# Patient Record
Sex: Female | Born: 1975 | Race: Black or African American | Hispanic: No
Health system: Southern US, Community
[De-identification: ages and names within clinical notes are randomized; demographics above are authoritative.]

---

## 1999-04-18 ENCOUNTER — Ambulatory Visit (HOSPITAL_COMMUNITY): Admission: RE | Admit: 1999-04-18 | Discharge: 1999-04-18 | Payer: Self-pay | Admitting: *Deleted

## 1999-05-01 ENCOUNTER — Encounter: Admission: RE | Admit: 1999-05-01 | Discharge: 1999-05-01 | Payer: Self-pay | Admitting: Obstetrics & Gynecology

## 1999-05-15 ENCOUNTER — Encounter: Admission: RE | Admit: 1999-05-15 | Discharge: 1999-05-15 | Payer: Self-pay | Admitting: Obstetrics & Gynecology

## 1999-05-15 ENCOUNTER — Other Ambulatory Visit: Admission: RE | Admit: 1999-05-15 | Discharge: 1999-05-15 | Payer: Self-pay | Admitting: Obstetrics & Gynecology

## 1999-05-22 ENCOUNTER — Encounter: Admission: RE | Admit: 1999-05-22 | Discharge: 1999-05-22 | Payer: Self-pay | Admitting: Obstetrics & Gynecology

## 1999-05-29 ENCOUNTER — Encounter: Admission: RE | Admit: 1999-05-29 | Discharge: 1999-05-29 | Payer: Self-pay | Admitting: Obstetrics & Gynecology

## 1999-06-12 ENCOUNTER — Inpatient Hospital Stay (HOSPITAL_COMMUNITY): Admission: AD | Admit: 1999-06-12 | Discharge: 1999-06-12 | Payer: Self-pay | Admitting: *Deleted

## 1999-06-12 ENCOUNTER — Encounter: Admission: RE | Admit: 1999-06-12 | Discharge: 1999-06-12 | Payer: Self-pay | Admitting: Obstetrics & Gynecology

## 1999-06-19 ENCOUNTER — Inpatient Hospital Stay (HOSPITAL_COMMUNITY): Admission: AD | Admit: 1999-06-19 | Discharge: 1999-06-19 | Payer: Self-pay | Admitting: Obstetrics

## 1999-06-19 ENCOUNTER — Ambulatory Visit (HOSPITAL_COMMUNITY): Admission: RE | Admit: 1999-06-19 | Discharge: 1999-06-19 | Payer: Self-pay | Admitting: Obstetrics & Gynecology

## 1999-06-21 ENCOUNTER — Inpatient Hospital Stay (HOSPITAL_COMMUNITY): Admission: AD | Admit: 1999-06-21 | Discharge: 1999-06-21 | Payer: Self-pay | Admitting: *Deleted

## 1999-06-26 ENCOUNTER — Encounter: Admission: RE | Admit: 1999-06-26 | Discharge: 1999-06-26 | Payer: Self-pay | Admitting: Obstetrics & Gynecology

## 1999-07-10 ENCOUNTER — Inpatient Hospital Stay (HOSPITAL_COMMUNITY): Admission: AD | Admit: 1999-07-10 | Discharge: 1999-07-10 | Payer: Self-pay | Admitting: Obstetrics

## 1999-07-10 ENCOUNTER — Encounter: Admission: RE | Admit: 1999-07-10 | Discharge: 1999-07-10 | Payer: Self-pay | Admitting: Obstetrics & Gynecology

## 1999-07-10 ENCOUNTER — Encounter (HOSPITAL_COMMUNITY): Admission: RE | Admit: 1999-07-10 | Discharge: 1999-07-29 | Payer: Self-pay | Admitting: Obstetrics & Gynecology

## 1999-07-17 ENCOUNTER — Inpatient Hospital Stay (HOSPITAL_COMMUNITY): Admission: AD | Admit: 1999-07-17 | Discharge: 1999-07-17 | Payer: Self-pay | Admitting: Obstetrics

## 1999-07-17 ENCOUNTER — Encounter: Admission: RE | Admit: 1999-07-17 | Discharge: 1999-07-17 | Payer: Self-pay | Admitting: Obstetrics & Gynecology

## 1999-07-24 ENCOUNTER — Encounter: Admission: RE | Admit: 1999-07-24 | Discharge: 1999-07-24 | Payer: Self-pay | Admitting: Obstetrics & Gynecology

## 1999-07-29 ENCOUNTER — Encounter (INDEPENDENT_AMBULATORY_CARE_PROVIDER_SITE_OTHER): Payer: Self-pay | Admitting: Specialist

## 1999-07-29 ENCOUNTER — Inpatient Hospital Stay (HOSPITAL_COMMUNITY): Admission: AD | Admit: 1999-07-29 | Discharge: 1999-07-31 | Payer: Self-pay | Admitting: Obstetrics

## 1999-08-01 ENCOUNTER — Encounter (HOSPITAL_COMMUNITY): Admission: RE | Admit: 1999-08-01 | Discharge: 1999-10-30 | Payer: Self-pay | Admitting: *Deleted

## 2001-01-14 ENCOUNTER — Encounter: Payer: Self-pay | Admitting: Emergency Medicine

## 2001-01-14 ENCOUNTER — Emergency Department (HOSPITAL_COMMUNITY): Admission: EM | Admit: 2001-01-14 | Discharge: 2001-01-14 | Payer: Self-pay | Admitting: Emergency Medicine

## 2001-06-24 ENCOUNTER — Encounter: Payer: Self-pay | Admitting: Emergency Medicine

## 2001-06-24 ENCOUNTER — Emergency Department (HOSPITAL_COMMUNITY): Admission: EM | Admit: 2001-06-24 | Discharge: 2001-06-24 | Payer: Self-pay | Admitting: Nurse Practitioner

## 2001-06-25 ENCOUNTER — Emergency Department (HOSPITAL_COMMUNITY): Admission: EM | Admit: 2001-06-25 | Discharge: 2001-06-25 | Payer: Self-pay | Admitting: Emergency Medicine

## 2001-07-13 ENCOUNTER — Encounter: Payer: Self-pay | Admitting: Specialist

## 2001-07-13 ENCOUNTER — Encounter: Admission: RE | Admit: 2001-07-13 | Discharge: 2001-07-13 | Payer: Self-pay | Admitting: Specialist

## 2002-01-30 ENCOUNTER — Emergency Department (HOSPITAL_COMMUNITY): Admission: EM | Admit: 2002-01-30 | Discharge: 2002-01-30 | Payer: Self-pay | Admitting: Emergency Medicine

## 2002-01-30 ENCOUNTER — Encounter: Payer: Self-pay | Admitting: Emergency Medicine

## 2005-06-12 ENCOUNTER — Ambulatory Visit: Payer: Self-pay | Admitting: Psychiatry

## 2005-06-12 ENCOUNTER — Inpatient Hospital Stay (HOSPITAL_COMMUNITY): Admission: EM | Admit: 2005-06-12 | Discharge: 2005-06-13 | Payer: Self-pay | Admitting: Psychiatry

## 2005-08-30 ENCOUNTER — Emergency Department (HOSPITAL_COMMUNITY): Admission: EM | Admit: 2005-08-30 | Discharge: 2005-08-30 | Payer: Self-pay | Admitting: Emergency Medicine

## 2006-01-19 ENCOUNTER — Emergency Department (HOSPITAL_COMMUNITY): Admission: EM | Admit: 2006-01-19 | Discharge: 2006-01-19 | Payer: Self-pay | Admitting: Emergency Medicine

## 2006-01-22 ENCOUNTER — Emergency Department (HOSPITAL_COMMUNITY): Admission: EM | Admit: 2006-01-22 | Discharge: 2006-01-22 | Payer: Self-pay | Admitting: Family Medicine

## 2007-01-28 IMAGING — CR DG FOOT COMPLETE 3+V*L*
3 series · 3 of 3 positions shown · non-contrast
Comparison: None.

CLINICAL DATA: Pain at ball of foot for two days.  No known injury.
 LEFT FOOT - 3 VIEWS:

[view not recorded (1 of 3)]
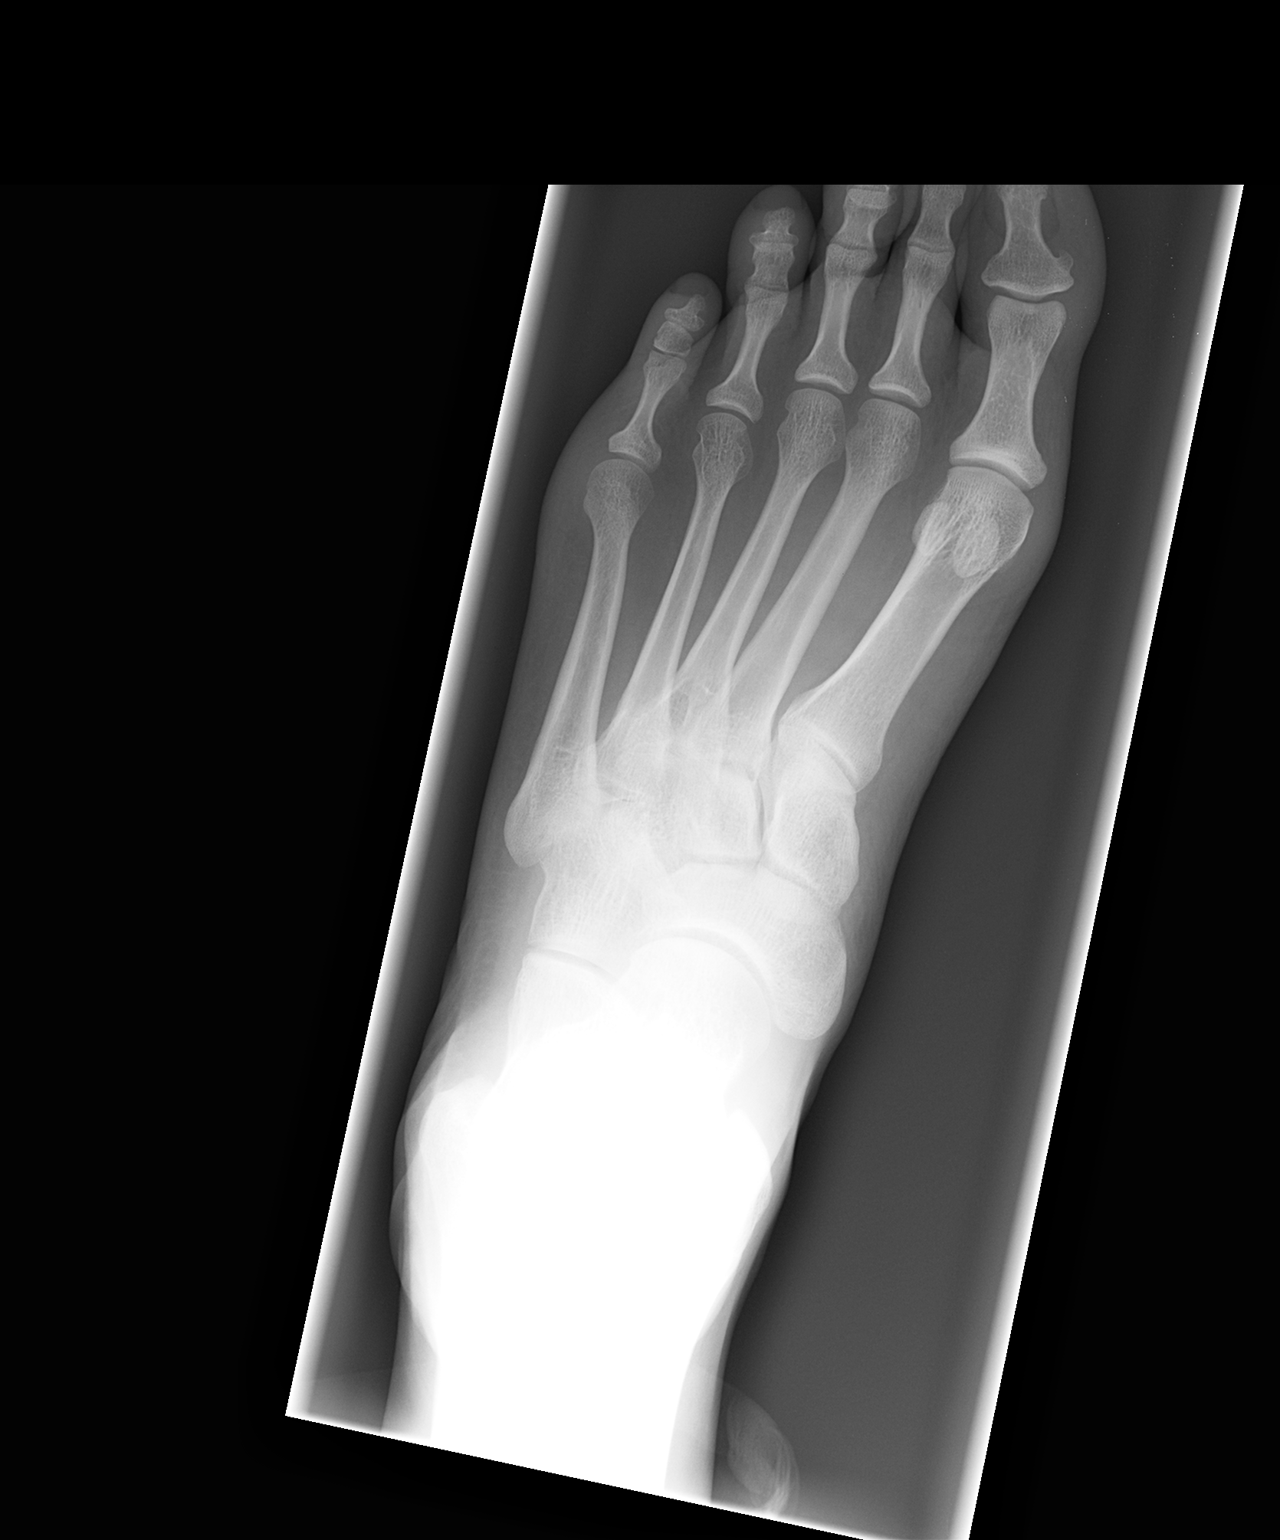

[view not recorded (2 of 3)]
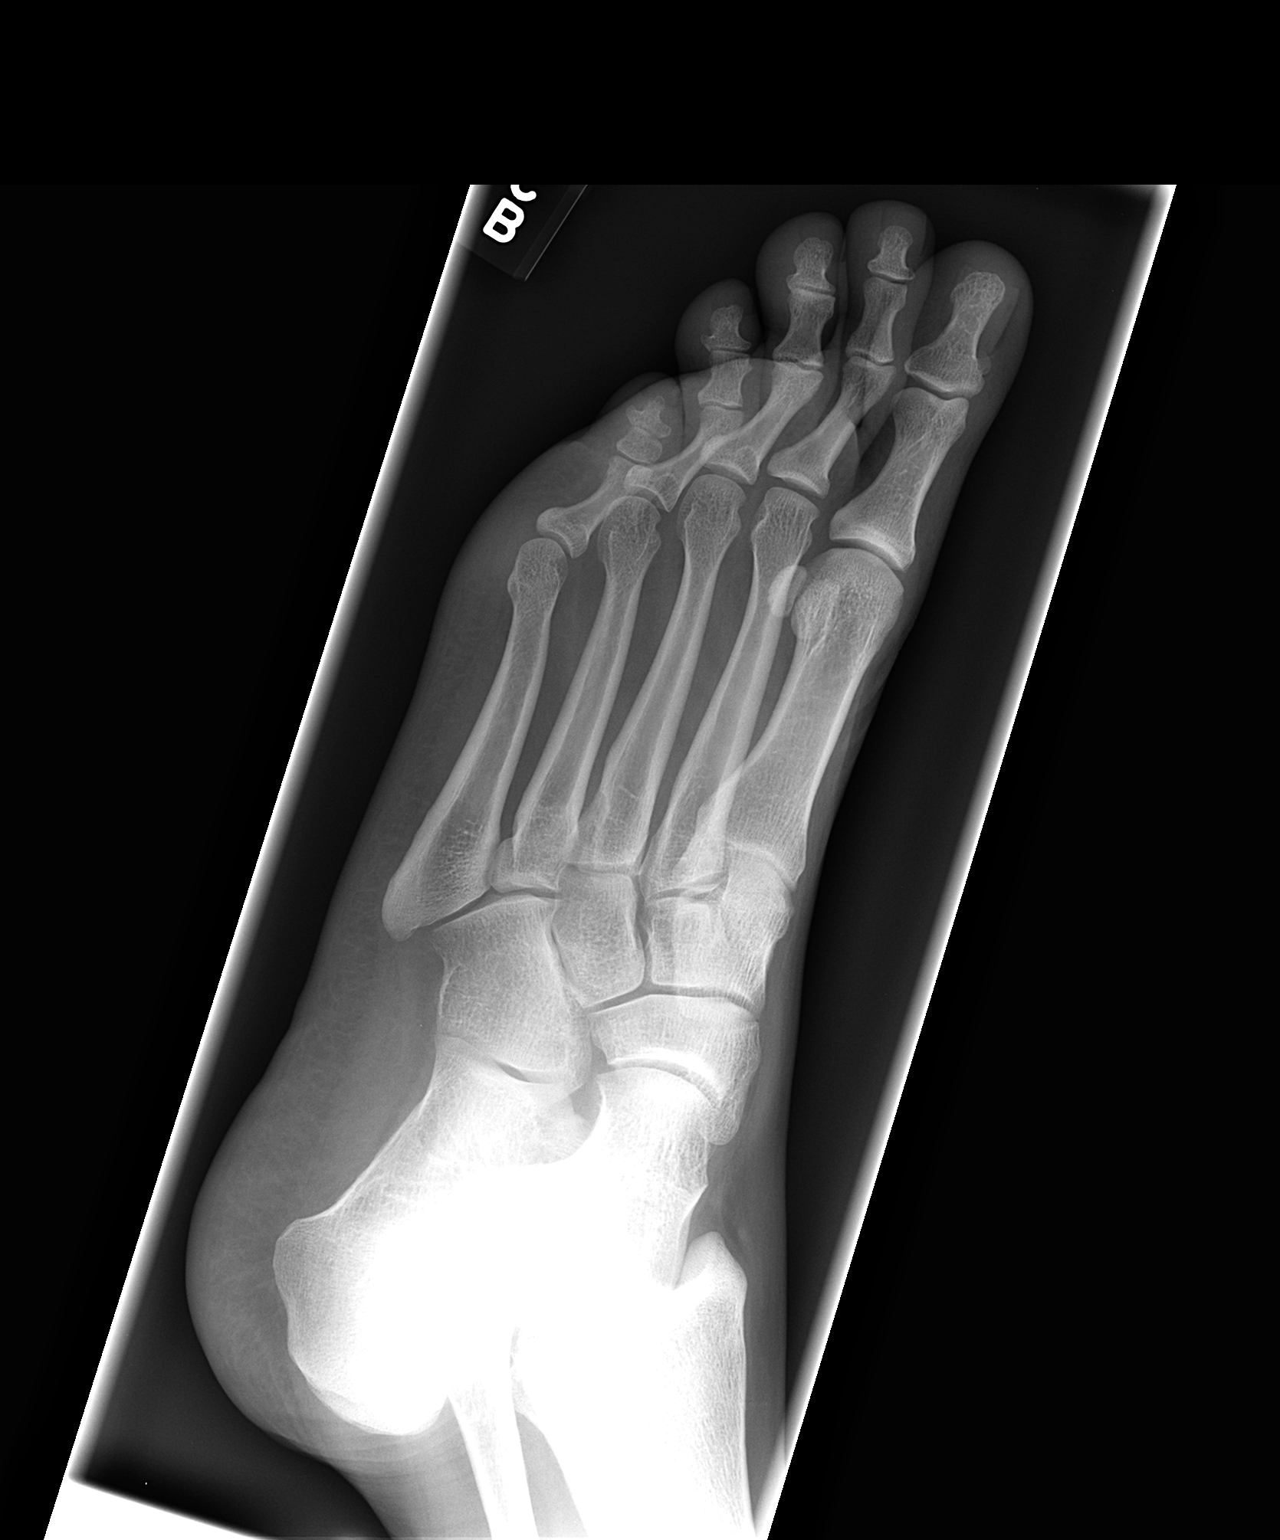

[view not recorded (3 of 3)]
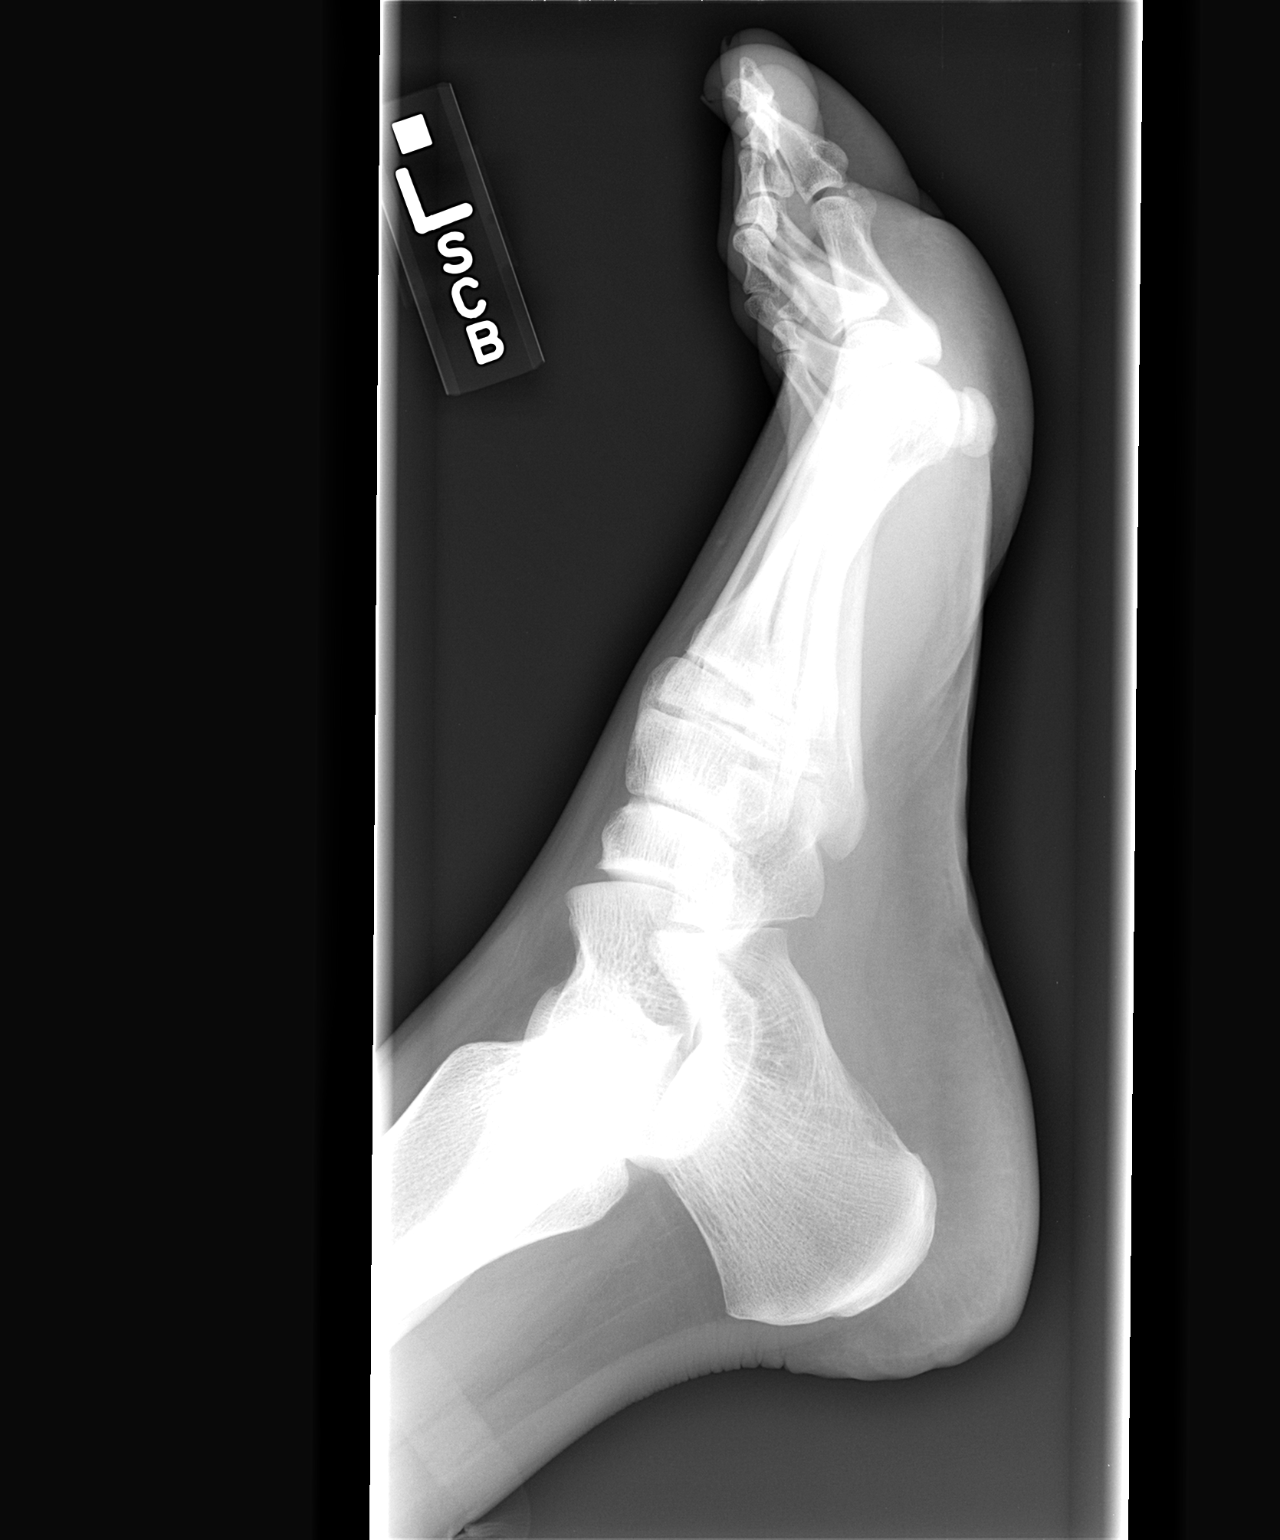

[3 of 3 positions shown; findings below may reference images not displayed]

FINDINGS: No acute radiographic abnormalities are noted.  Specifically, I see no fractures or dislocations.
IMPRESSION: No acute findings.

## 2007-04-24 ENCOUNTER — Emergency Department (HOSPITAL_COMMUNITY): Admission: EM | Admit: 2007-04-24 | Discharge: 2007-04-24 | Payer: Self-pay | Admitting: Emergency Medicine
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
## 2007-12-15 ENCOUNTER — Emergency Department (HOSPITAL_COMMUNITY): Admission: EM | Admit: 2007-12-15 | Discharge: 2007-12-15 | Payer: Self-pay | Admitting: Family Medicine

## 2009-11-03 ENCOUNTER — Encounter: Admission: RE | Admit: 2009-11-03 | Discharge: 2009-11-03 | Payer: Self-pay | Admitting: Rheumatology
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
## 2011-04-11 NOTE — Discharge Summary (Signed)
NAME:  Janice, Kelly              ACCOUNT NO.:  [id number]   MEDICAL RECORD NO.:  [id number]          PATIENT TYPE:  IPS   LOCATION:  0402                          FACILITY:  [facility]   PHYSICIAN:  Anselm Jungling, MD  DATE OF BIRTH:  1976-09-23   DATE OF ADMISSION:  06/12/2005  DATE OF DISCHARGE:  06/13/2005                                 DISCHARGE SUMMARY   IDENTIFYING DATA AND REASON FOR ADMISSION:  This is the first [facility] admission  for Janice Kelly, a 35 year old African-American female, who was admitted due to  increasing depression, thoughts of cutting herself, moods that she felt were  out of control and racing thoughts.   HISTORY OF PRESENTING PROBLEMS:  The patient had a history of depression and  borderline problems, according to herself.  In a prior inpatient admission  she had been told that she had a personality disorder.  She was on no  medications at the time of admission to our program.   INITIAL DIAGNOSTIC IMPRESSION:  AXIS I:  Rule out bipolar affective  disorder, not otherwise specified.  AXIS II:  Borderline personality disorder.  AXIS III:  No illnesses.  AXIS IV:  Stressors severe.  AXIS V:  Global assessment of function on admission 25.   MEDICAL AND LABORATORY:  There were no significant medical issues during  this very brief inpatient psychiatric stay.   HOSPITAL COURSE:  The patient presented as a normally developed [place]-  American female.  She was extremely tearful, distraught, and agitated on  admission and acknowledge suicidal ideation.  For this reason, she was  placed on one-to-one observation.  She had a discussion with the undersigned  regarding a trial of Lexapro to address her depression, but she decided  against this.  She decided that she did not want to be in the inpatient  setting and signed a 72-hour notice.  She cited a variety of complaints  about the way treatment staff was approaching her.   However, on the second hospital day, the  patient's presentation was  completely different.  She was bright, pleasant, quite open, and apologized  for her attitude the day prior.  She had no suicidal ideation.  She  indicated that she hoped to be discharged and continue in outpatient  treatment.  Her statements thus seemed sincere and convincing.  Thusly, the  patient was discharged on the second hospital day.   AFTERCARE:  The patient was to follow up at [facility] on  June 19, 2005 at 8:00 a.m. for an intake appointment with Janice Kelly.   DISCHARGE MEDICATIONS:  None.   It was anticipated that the patient would have another medication evaluation  at [facility].   DISCHARGE DIAGNOSES:  AXIS I:  Depressive disorder, not otherwise specified.  AXIS II:  Borderline personality traits.  AXIS III:  No acute or chronic illnesses.  AXIS IV:  Stressors, severe.  AXIS V:  Global assessment of function on discharge 50.           ______________________________  Anselm Jungling, MD  Electronically Signed  SPB/MEDQ  D:  07/04/2005  T:  07/04/2005  Job:  [id number]

## 2017-06-21 ENCOUNTER — Emergency Department (HOSPITAL_COMMUNITY)
Admission: EM | Admit: 2017-06-21 | Discharge: 2017-06-21 | Disposition: A | Discharge status: Home / Self Care | Payer: Non-veteran care | Attending: Emergency Medicine | Admitting: Emergency Medicine

## 2017-06-21 ENCOUNTER — Encounter (HOSPITAL_COMMUNITY): Payer: Self-pay | Admitting: Emergency Medicine

## 2017-06-21 ENCOUNTER — Emergency Department (HOSPITAL_COMMUNITY): Payer: Non-veteran care

## 2017-06-21 DIAGNOSIS — J209 Acute bronchitis, unspecified: Secondary | ICD-10-CM | POA: Insufficient documentation

## 2017-06-21 DIAGNOSIS — R0981 Nasal congestion: Secondary | ICD-10-CM | POA: Diagnosis present

## 2017-06-21 LAB — CBC
HCT: 44 % (ref 36.0–46.0)
HEMOGLOBIN: 15.1 g/dL — AB (ref 12.0–15.0)
MCH: 31.7 pg (ref 26.0–34.0)
MCHC: 34.3 g/dL (ref 30.0–36.0)
MCV: 92.2 fL (ref 78.0–100.0)
PLATELETS: 188 10*3/uL (ref 150–400)
RBC: 4.77 MIL/uL (ref 3.87–5.11)
RDW: 12.8 % (ref 11.5–15.5)
WBC: 8.3 10*3/uL (ref 4.0–10.5)

## 2017-06-21 LAB — D-DIMER, QUANTITATIVE: D-Dimer, Quant: 0.38 ug/mL-FEU (ref 0.00–0.50)

## 2017-06-21 LAB — COMPREHENSIVE METABOLIC PANEL
ALK PHOS: 99 U/L (ref 38–126)
ALT: 17 U/L (ref 14–54)
ANION GAP: 13 (ref 5–15)
AST: 29 U/L (ref 15–41)
Albumin: 4.3 g/dL (ref 3.5–5.0)
BILIRUBIN TOTAL: 0.8 mg/dL (ref 0.3–1.2)
BUN: 5 mg/dL — ABNORMAL LOW (ref 6–20)
CALCIUM: 9.5 mg/dL (ref 8.9–10.3)
CO2: 22 mmol/L (ref 22–32)
CREATININE: 0.79 mg/dL (ref 0.44–1.00)
Chloride: 105 mmol/L (ref 101–111)
Glucose, Bld: 88 mg/dL (ref 65–99)
Potassium: 3.8 mmol/L (ref 3.5–5.1)
Sodium: 140 mmol/L (ref 135–145)
TOTAL PROTEIN: 7.9 g/dL (ref 6.5–8.1)

## 2017-06-21 LAB — I-STAT BETA HCG BLOOD, ED (MC, WL, AP ONLY)

## 2017-06-21 LAB — LIPASE, BLOOD: Lipase: 24 U/L (ref 11–51)

## 2017-06-21 MED ORDER — ALBUTEROL SULFATE (2.5 MG/3ML) 0.083% IN NEBU
INHALATION_SOLUTION | RESPIRATORY_TRACT | Status: AC
  Filled 2017-06-21: qty 3

## 2017-06-21 MED ORDER — PREDNISONE 20 MG PO TABS
40.0000 mg | ORAL_TABLET | Freq: Once | ORAL | Status: AC
  Administered 2017-06-21: 40 mg via ORAL
  Filled 2017-06-21: qty 2

## 2017-06-21 MED ORDER — ALBUTEROL SULFATE HFA 108 (90 BASE) MCG/ACT IN AERS
2.0000 | INHALATION_SPRAY | RESPIRATORY_TRACT | Status: DC | PRN
  Administered 2017-06-21: 2 via RESPIRATORY_TRACT
  Filled 2017-06-21: qty 6.7

## 2017-06-21 MED ORDER — PREDNISONE 10 MG PO TABS: 20.0000 mg | ORAL_TABLET | Freq: Every day | ORAL | 0 refills | Status: AC

## 2017-06-21 MED ORDER — ALBUTEROL SULFATE (2.5 MG/3ML) 0.083% IN NEBU
5.0000 mg | INHALATION_SOLUTION | Freq: Once | RESPIRATORY_TRACT | Status: AC
  Administered 2017-06-21: 5 mg via RESPIRATORY_TRACT
  Filled 2017-06-21: qty 6

## 2017-06-21 MED ORDER — ALBUTEROL SULFATE (2.5 MG/3ML) 0.083% IN NEBU
5.0000 mg | INHALATION_SOLUTION | Freq: Once | RESPIRATORY_TRACT | Status: AC
  Administered 2017-06-21: 5 mg via RESPIRATORY_TRACT

## 2017-06-21 MED ORDER — AEROCHAMBER PLUS FLO-VU MEDIUM MISC
1.0000 | Freq: Once | Status: AC
  Administered 2017-06-21: 1
  Filled 2017-06-21: qty 1

## 2017-06-21 NOTE — ED Triage Notes (Signed)
Pt reports shortness of breath since Thursday, with a cough with scant amount of productive mucous. Pt reports when she coughs she sometimes urinates. Pt also reports diarrhea yesterday that was yellow.

## 2017-06-21 NOTE — ED Provider Notes (Signed)
[facility]-EMERGENCY DEPT Provider Note   CSN: [id number] Arrival date & time: 06/21/17  1038     History   Chief Complaint Chief Complaint  Patient presents with  . Shortness of Breath  . Anxiety  . Diarrhea    HPI Bartolo Darteratrice Cubillos is a 41 y.o. female.  HPI  41 year old female history of smoking presents today with 2-3 days of nasal congestion, cough, and dyspnea. Had subjective fever and chills. She's had some nausea and decreased appetite but no vomiting. She did have an episode of loose bowel movement yesterday. She denies chest pain, abdominal pain, lateralized leg swelling, history of DVT, or PE. She states that she has had her tubes tied and her periods are normal.  History reviewed. No pertinent past medical history.  There are no active problems to display for this patient.   No past surgical history on file.  OB History    No data available       Home Medications    Prior to Admission medications   Not on File    Family History No family history on file.  Social History Social History  Substance Use Topics  . Smoking status: Not on file  . Smokeless tobacco: Not on file  . Alcohol use Not on file     Allergies   Penicillins and Shellfish allergy   Review of Systems Review of Systems  All other systems reviewed and are negative.    Physical Exam Updated Vital Signs BP 111/68   Pulse 84   Temp 97.8 F (36.6 C)   Resp 18   LMP 06/02/2017   SpO2 100%   Physical Exam  Constitutional: She is oriented to person, place, and time. She appears well-developed and well-nourished. No distress.  HENT:  Head: Normocephalic and atraumatic.  Right Ear: External ear normal.  Left Ear: External ear normal.  Nose: Nose normal.  Eyes: Pupils are equal, round, and reactive to light. Conjunctivae and EOM are normal.  Neck: Normal range of motion. Neck supple.  Cardiovascular: Normal rate, regular rhythm and normal heart sounds.   Pulmonary/Chest: Effort  normal and breath sounds normal. No respiratory distress. She has no wheezes.  Some increased respiratory rate  Abdominal: Soft. Bowel sounds are normal.  Musculoskeletal: Normal range of motion. She exhibits no edema or tenderness.  Neurological: She is alert and oriented to person, place, and time. She exhibits normal muscle tone. Coordination normal.  Skin: Skin is warm and dry.  Psychiatric: She has a normal mood and affect. Her behavior is normal. Thought content normal.  Nursing note and vitals reviewed.    ED Treatments / Results  Labs (all labs ordered are listed, but only abnormal results are displayed) Labs Reviewed  COMPREHENSIVE METABOLIC PANEL - Abnormal; Notable for the following:       Result Value   BUN 5 (*)    All other components within normal limits  CBC - Abnormal; Notable for the following:    Hemoglobin 15.1 (*)    All other components within normal limits  LIPASE, BLOOD  D-DIMER, QUANTITATIVE (NOT AT [facility])  I-STAT BETA HCG BLOOD, ED ([facility], [facility], [facility] ONLY)    EKG  EKG Interpretation  Date/Time:  Sunday June 21 2017 10:41:26 EDT Ventricular Rate:  100 PR Interval:  138 QRS Duration: 80 QT Interval:  346 QTC Calculation: 446 R Axis:   -23 Text Interpretation:  Normal sinus rhythm Right atrial enlargement Nonspecific ST and T wave abnormality Abnormal ECG Confirmed by  Margarita GrizzleRay, Deklan Minar [phone]([id number]) on 06/21/2017 11:15:41 AM       Radiology Dg Chest 2 View  Result Date: 06/21/2017 CLINICAL DATA:  Cough, shortness of breath. EXAM: CHEST  2 VIEW COMPARISON:  None. FINDINGS: Normal cardiac and mediastinal contours. No consolidative pulmonary opacities. No pleural effusion or pneumothorax. Regional skeleton is unremarkable. IMPRESSION: No acute cardiopulmonary process. Electronically Signed   By: Annia Beltrew  Davis M.D.   On: 06/21/2017 11:14    Procedures Procedures (including critical care time)  Medications Ordered in ED Medications  albuterol (PROVENTIL) (2.5 MG/3ML)  0.083% nebulizer solution (not administered)  albuterol (PROVENTIL) (2.5 MG/3ML) 0.083% nebulizer solution 5 mg (5 mg Nebulization Given 06/21/17 1056)  albuterol (PROVENTIL) (2.5 MG/3ML) 0.083% nebulizer solution 5 mg (5 mg Nebulization Given 06/21/17 1207)  predniSONE (DELTASONE) tablet 40 mg (40 mg Oral Given 06/21/17 1207)     Initial Impression / Assessment and Plan / ED Course  I have reviewed the triage vital signs and the nursing notes.  Pertinent labs & imaging results that were available during my care of the patient were reviewed by me and considered in my medical decision making (see chart for details).     This is a [age] year old female who presents today with symptoms consistent with bronchitis and some dyspnea. Symptoms are resolved with albuterol. Chest x-Journee Kohen shows no evidence of acute infiltrate. I have discussed smoking cessation with patient. We have discussed return precautions and need for close follow-up. She voices understanding. Plan albuterol HFA and prednisone.  Final Clinical Impressions(s) / ED Diagnoses   Final diagnoses:  Bronchitis, acute, with bronchospasm    New Prescriptions New Prescriptions   PREDNISONE (DELTASONE) 10 MG TABLET    Take 2 tablets (20 mg total) by mouth daily.     Margarita Grizzleay, Lazarus [place], MD 06/21/17 1316

## 2018-11-19 IMAGING — DX DG CHEST 2V
2 series · 2 of 2 positions shown · non-contrast
Comparison: None.

CLINICAL DATA: Cough, shortness of breath.

EXAM:
CHEST  2 VIEW

[w chest pa]
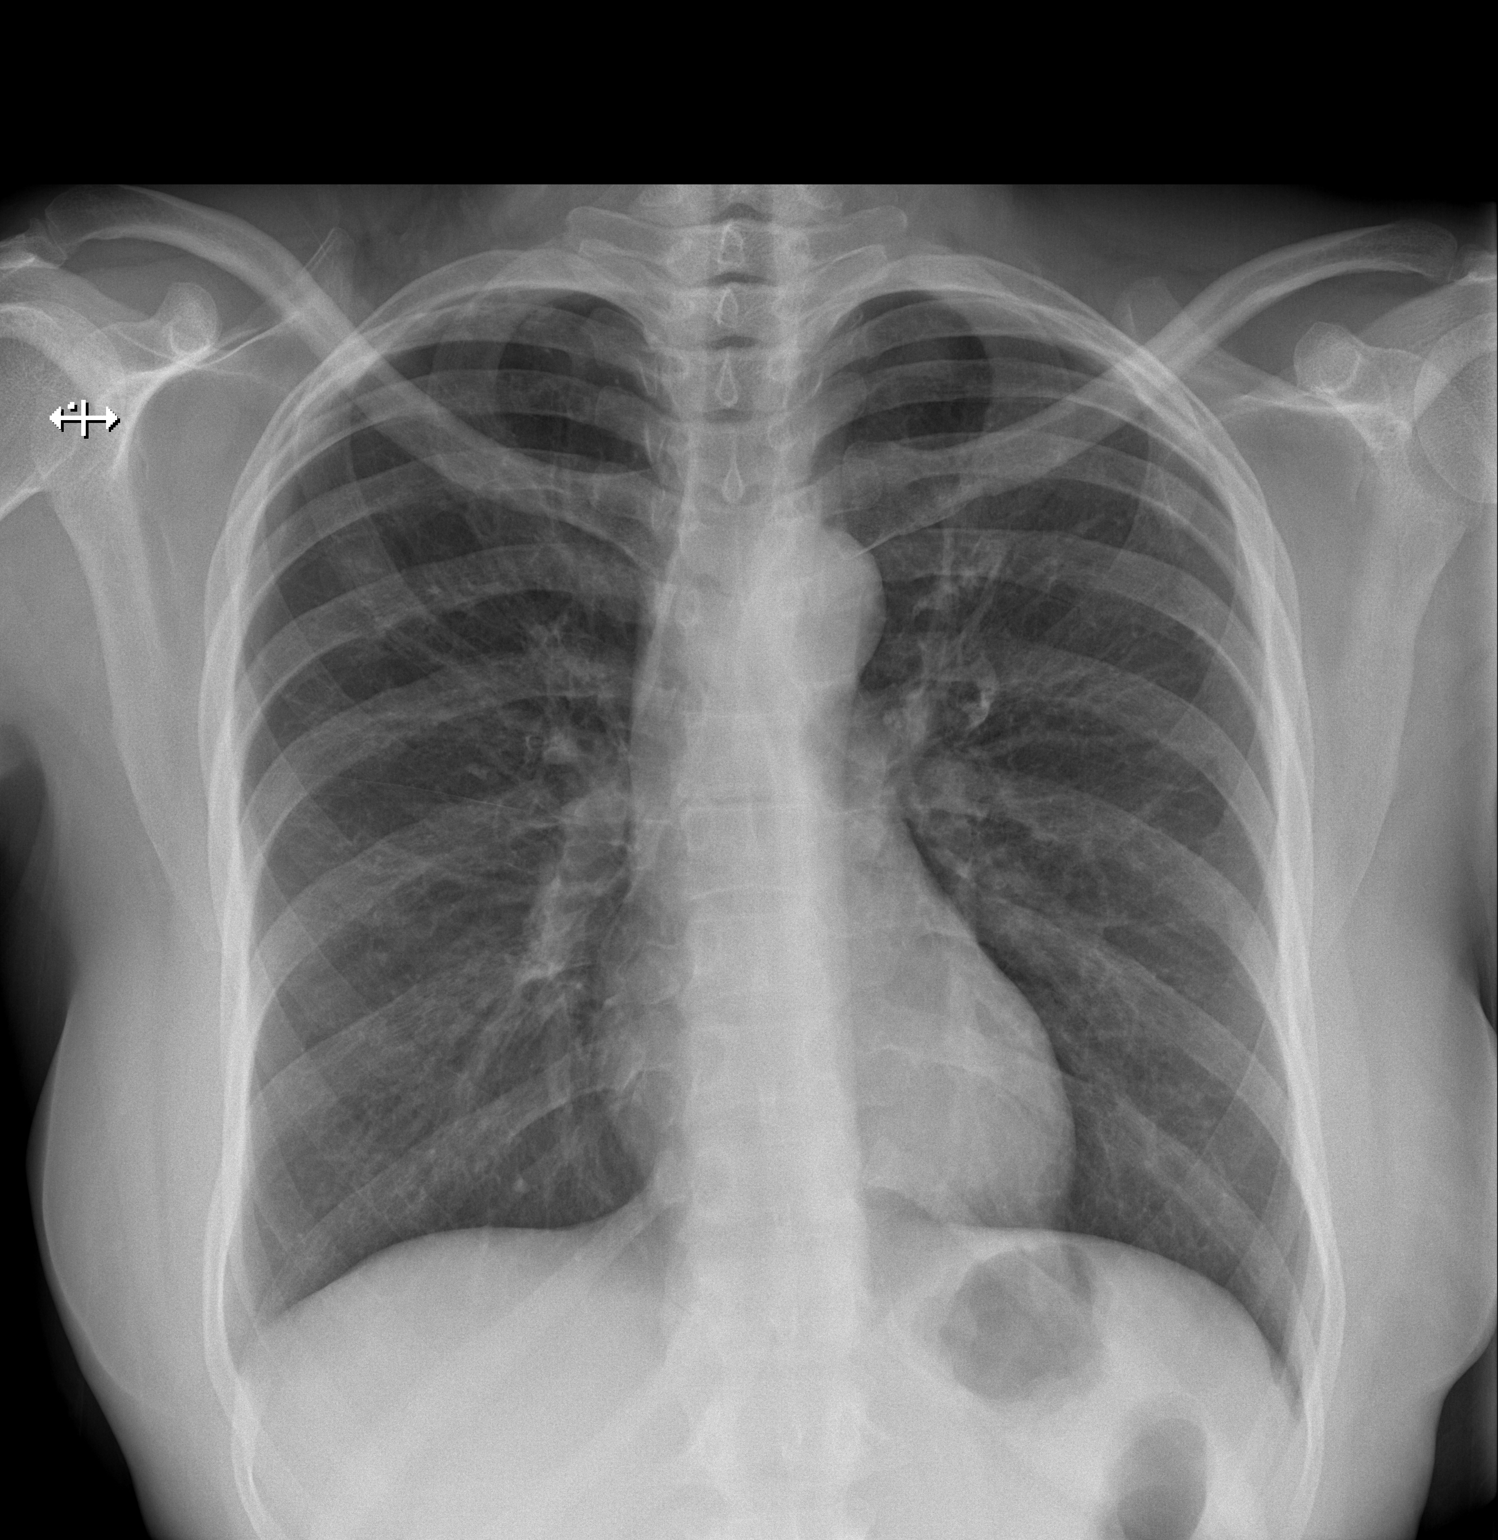

[w chest lat]
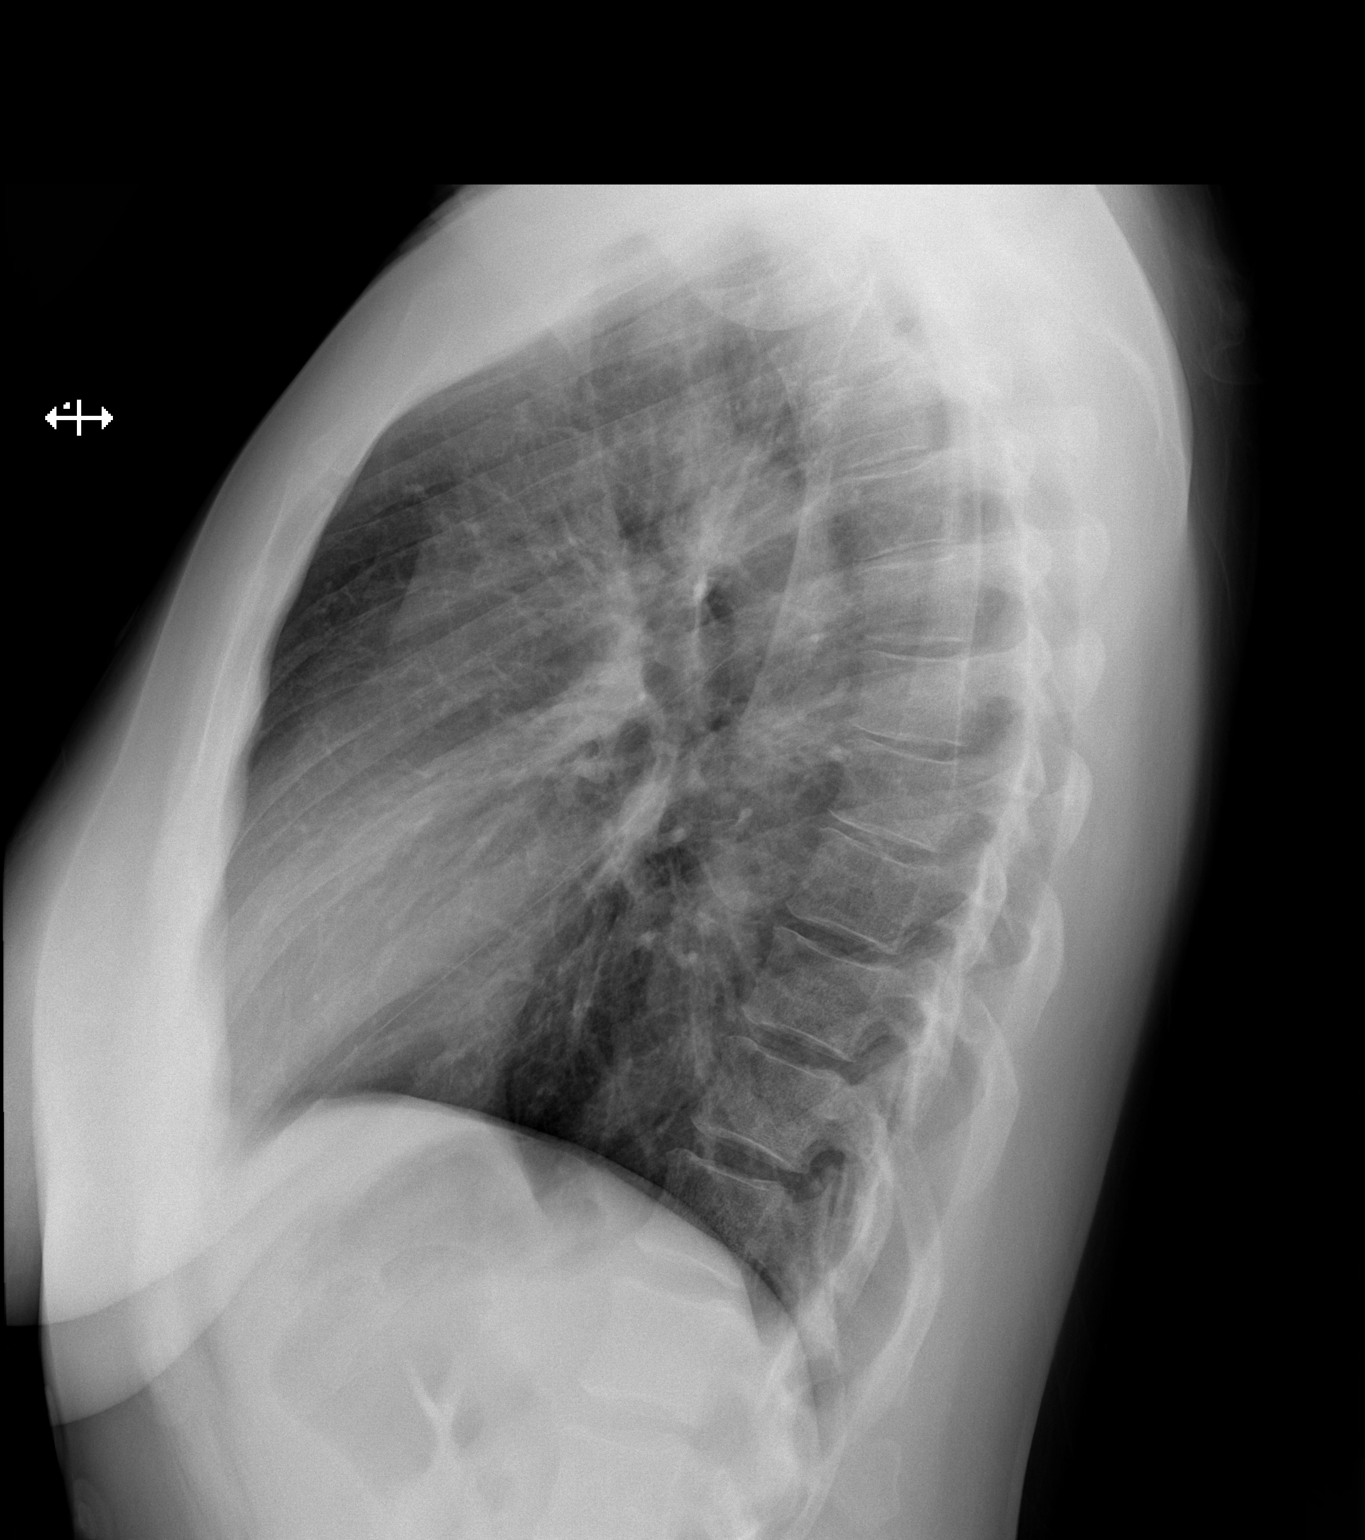

[2 of 2 positions shown; findings below may reference images not displayed]

FINDINGS: Normal cardiac and mediastinal contours. No consolidative pulmonary
opacities. No pleural effusion or pneumothorax. Regional skeleton is
unremarkable.
IMPRESSION: No acute cardiopulmonary process.

## 2019-03-22 ENCOUNTER — Telehealth (INDEPENDENT_AMBULATORY_CARE_PROVIDER_SITE_OTHER): Payer: Self-pay | Admitting: Pediatrics

## 2019-03-22 NOTE — Telephone Encounter (Signed)
Opened in error

## 2022-04-16 ENCOUNTER — Other Ambulatory Visit: Payer: Self-pay

## 2022-04-16 ENCOUNTER — Encounter (HOSPITAL_COMMUNITY): Payer: Self-pay | Admitting: Emergency Medicine

## 2022-04-16 ENCOUNTER — Emergency Department (HOSPITAL_COMMUNITY)
Admission: EM | Admit: 2022-04-16 | Discharge: 2022-04-16 | Disposition: A | Discharge status: Home / Self Care | Payer: No Typology Code available for payment source | Attending: Emergency Medicine | Admitting: Emergency Medicine

## 2022-04-16 DIAGNOSIS — R1032 Left lower quadrant pain: Secondary | ICD-10-CM | POA: Insufficient documentation

## 2022-04-16 DIAGNOSIS — R42 Dizziness and giddiness: Secondary | ICD-10-CM | POA: Insufficient documentation

## 2022-04-16 DIAGNOSIS — S199XXA Unspecified injury of neck, initial encounter: Secondary | ICD-10-CM | POA: Insufficient documentation

## 2022-04-16 DIAGNOSIS — S3993XA Unspecified injury of pelvis, initial encounter: Secondary | ICD-10-CM | POA: Diagnosis not present

## 2022-04-16 DIAGNOSIS — M545 Low back pain, unspecified: Secondary | ICD-10-CM | POA: Diagnosis not present

## 2022-04-16 DIAGNOSIS — Y92410 Unspecified street and highway as the place of occurrence of the external cause: Secondary | ICD-10-CM | POA: Insufficient documentation

## 2022-04-16 DIAGNOSIS — S0990XA Unspecified injury of head, initial encounter: Secondary | ICD-10-CM | POA: Insufficient documentation

## 2022-04-16 DIAGNOSIS — S299XXA Unspecified injury of thorax, initial encounter: Secondary | ICD-10-CM | POA: Insufficient documentation

## 2022-04-16 DIAGNOSIS — R1031 Right lower quadrant pain: Secondary | ICD-10-CM | POA: Diagnosis not present

## 2022-04-16 MED ORDER — ACETAMINOPHEN 500 MG PO TABS
1000.0000 mg | ORAL_TABLET | Freq: Once | ORAL | Status: AC
  Administered 2022-04-16: 1000 mg via ORAL
  Filled 2022-04-16: qty 2

## 2022-04-16 MED ORDER — FENTANYL CITRATE PF 50 MCG/ML IJ SOSY: 50.0000 ug | PREFILLED_SYRINGE | Freq: Once | INTRAMUSCULAR | Status: DC

## 2022-04-16 MED ORDER — ACETAMINOPHEN 325 MG PO TABS: 650.0000 mg | ORAL_TABLET | Freq: Four times a day (QID) | ORAL | 0 refills | Status: AC | PRN

## 2022-04-16 NOTE — ED Notes (Signed)
Pt approached this RN at the nurses station asking if I was a nurse here. Pt educated that I am the new nurse coming on shift and was not in the loop on her care yet but would help how I could. Pt rudely stating "where are my discharge papers". Pt educated that she has been up for discharge for 2 minutes and that her papers were not printed by the EDP and he was probably putting important information on the paper. Pt continued to stay a nurses station speaking rudely to all staff. Pt asked to sit back in her bed and we would bring her papers and tylenol when they were ready.
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
## 2022-04-16 NOTE — Discharge Instructions (Addendum)
Please refer to the attached instructions. Follow-up with your care provider at the [place].
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
## 2022-04-16 NOTE — ED Notes (Signed)
RN approached pt to start IV due to ordered CT scan and labs. Pt stated she has researched contrast and is not putting that in her body. ED PA made aware. While RN was talking to PA, pt spoke to NT stating she was not going to get any scans done.

## 2022-04-16 NOTE — ED Notes (Signed)
Pt continued to approach nurses station during report speaking rudely to staff

## 2022-04-16 NOTE — ED Notes (Addendum)
Pt requesting to leave with prescription meds.This Clinical research associate informed pt that she cannot leave without having xrays and CT scans that provider ordered, unless she decides to leave AMA. Pt states that is not leaving AMA, but is refusing the Xray and CT scans because " I do not put that contrast in my body". Pt is frustrated that no one had talked to her and wants to speak to provider. EDP Smith aware.
